# Patient Record
Sex: Male | Born: 1955 | Race: White | Hispanic: No | Marital: Married | Smoking: Current every day smoker
Health system: Southern US, Community
[De-identification: ages and names within clinical notes are randomized; demographics above are authoritative.]

## PROBLEM LIST (undated history)

## (undated) DIAGNOSIS — I4891 Unspecified atrial fibrillation: Secondary | ICD-10-CM

## (undated) DIAGNOSIS — I639 Cerebral infarction, unspecified: Secondary | ICD-10-CM

## (undated) HISTORY — PX: LUNG SURGERY: SHX703

---

## 2020-05-24 DIAGNOSIS — I4891 Unspecified atrial fibrillation: Secondary | ICD-10-CM | POA: Insufficient documentation

## 2020-05-24 DIAGNOSIS — Z87891 Personal history of nicotine dependence: Secondary | ICD-10-CM | POA: Insufficient documentation

## 2020-05-24 DIAGNOSIS — K7689 Other specified diseases of liver: Secondary | ICD-10-CM | POA: Insufficient documentation

## 2020-05-24 DIAGNOSIS — J439 Emphysema, unspecified: Secondary | ICD-10-CM | POA: Insufficient documentation

## 2020-05-24 DIAGNOSIS — J9601 Acute respiratory failure with hypoxia: Secondary | ICD-10-CM | POA: Insufficient documentation

## 2020-05-24 DIAGNOSIS — J984 Other disorders of lung: Secondary | ICD-10-CM | POA: Insufficient documentation

## 2020-05-26 DIAGNOSIS — R0902 Hypoxemia: Secondary | ICD-10-CM | POA: Insufficient documentation

## 2020-05-26 DIAGNOSIS — Z9981 Dependence on supplemental oxygen: Secondary | ICD-10-CM | POA: Insufficient documentation

## 2020-05-26 DIAGNOSIS — D649 Anemia, unspecified: Secondary | ICD-10-CM | POA: Insufficient documentation

## 2020-05-31 DIAGNOSIS — I63531 Cerebral infarction due to unspecified occlusion or stenosis of right posterior cerebral artery: Secondary | ICD-10-CM | POA: Insufficient documentation

## 2020-06-04 DIAGNOSIS — Z7901 Long term (current) use of anticoagulants: Secondary | ICD-10-CM | POA: Insufficient documentation

## 2020-08-05 DIAGNOSIS — Z8673 Personal history of transient ischemic attack (TIA), and cerebral infarction without residual deficits: Secondary | ICD-10-CM | POA: Insufficient documentation

## 2020-08-05 DIAGNOSIS — R7303 Prediabetes: Secondary | ICD-10-CM | POA: Insufficient documentation

## 2020-11-17 DIAGNOSIS — F17210 Nicotine dependence, cigarettes, uncomplicated: Secondary | ICD-10-CM | POA: Insufficient documentation

## 2020-11-17 DIAGNOSIS — H2513 Age-related nuclear cataract, bilateral: Secondary | ICD-10-CM | POA: Insufficient documentation

## 2020-11-17 DIAGNOSIS — A310 Pulmonary mycobacterial infection: Secondary | ICD-10-CM | POA: Insufficient documentation

## 2020-12-23 DIAGNOSIS — R911 Solitary pulmonary nodule: Secondary | ICD-10-CM | POA: Insufficient documentation

## 2021-04-15 ENCOUNTER — Encounter: Payer: Self-pay | Admitting: Urology

## 2021-04-15 ENCOUNTER — Other Ambulatory Visit: Payer: Self-pay

## 2021-04-15 ENCOUNTER — Ambulatory Visit (INDEPENDENT_AMBULATORY_CARE_PROVIDER_SITE_OTHER): Payer: 59 | Admitting: Urology

## 2021-04-15 VITALS — BP 122/78 | HR 92 | Ht 73.0 in | Wt 161.0 lb

## 2021-04-15 DIAGNOSIS — R972 Elevated prostate specific antigen [PSA]: Secondary | ICD-10-CM | POA: Diagnosis not present

## 2021-04-15 DIAGNOSIS — N401 Enlarged prostate with lower urinary tract symptoms: Secondary | ICD-10-CM | POA: Diagnosis not present

## 2021-04-15 MED ORDER — TAMSULOSIN HCL 0.4 MG PO CAPS: 0.4000 mg | ORAL_CAPSULE | Freq: Every day | ORAL | 0 refills | Status: AC

## 2021-04-15 NOTE — Progress Notes (Signed)
04/15/2021 3:49 PM   Jesse Davenport 1956-06-15 [id number]  Referring provider: Lorn Junes, FNP [address] [place],  [place] [address]  Chief Complaint  Patient presents with   Elevated PSA    HPI: Jesse Davenport is a 65 y.o. male referred for evaluation of an elevated PSA.  PSA 01/30/2021 elevated 6.6; no previous PSA results + Lower urinary tract symptoms including decreased force and caliber of his urinary stream Denies UTI, gross hematuria No family history prostate cancer No prior urologic history or previous urologic evaluation On Eliquis for atrial fibrillation, DVT and CVA   PMH: LUE brachial vein DVT Atrial fibrillation Right PCA occipital lobe infarct 2021  Surgical History:  EXTRACTION CATARACT EXTRACAPSULAR W/INSERTION INTRAOCULAR PROSTHESIS Left 11/19/2020  Procedure: LEFT: Phaco/Monofocal -EXTRACAPSULAR CATARACT PHACO REMOVAL WITH INSERTION OF INTRAOCULAR LENS PROSTHESIS (1 STAGE PROCEDURE), MANUAL OR MECHANICAL TECHNIQUE; WITHOUT ENDOSCOPIC CYCLOPHOTOCOAGULATION; Surgeon: Synetta Fail, MD; Location: [facility]; Service: Ophthalmology; Laterality: Left;   EXTRACTION CATARACT EXTRACAPSULAR W/INSERTION INTRAOCULAR PROSTHESIS Right 12/03/2020  Procedure: RIGHT: Phaco/Monofocal -EXTRACAPSULAR CATARACT PHACO REMOVAL WITH INSERTION OF INTRAOCULAR LENS PROSTHESIS (1 STAGE PROCEDURE), MANUAL OR MECHANICAL TECHNIQUE; WITHOUT ENDOSCOPIC CYCLOPHOTOCOAGULATION; Surgeon: Synetta Fail, MD; Location: [facility]; Service: Ophthalmology; Laterality: Right;   EYE SURGERY  as an infant   HERNIA REPAIR   LENS EYE SURGERY Left 11/19/2020  pciol   THORACOSCOPY WITH DECORTICATION LUNG Left 05/30/2020  Procedure: THORACOSCOPY, SURGICAL; DECORT,; Surgeon: Jennings Books, MD; Location: [facility]; Service: Cardiothoracic; Laterality: Left;   Home Medications:  Allergies as of 04/15/2021       Reactions   Levofloxacin Anaphylaxis    Levalbuterol         Medication List        Accurate as of April 15, 2021 11:59 PM. If you have any questions, ask your nurse or doctor.          albuterol 108 (90 Base) MCG/ACT inhaler Commonly known as: VENTOLIN HFA SMARTSIG:2 Puff(s) By Mouth Every 6 Hours PRN   Eliquis 5 MG Tabs tablet Generic drug: apixaban Take 5 mg by mouth 2 (two) times daily.   Flovent HFA 110 MCG/ACT inhaler Generic drug: fluticasone Inhale 2 puffs into the lungs 2 (two) times daily.   Stiolto Respimat 2.5-2.5 MCG/ACT Aers Generic drug: Tiotropium Bromide-Olodaterol SMARTSIG:2 Puff(s) Via Inhaler Daily   tamsulosin 0.4 MG Caps capsule Commonly known as: FLOMAX Take 1 capsule (0.4 mg total) by mouth daily. Started by: Riki Altes, MD        Allergies:  Allergies  Allergen Reactions   Levofloxacin Anaphylaxis   Levalbuterol     Family History: History reviewed. No pertinent family history.  Social History:  reports that he has been smoking cigarettes. He has never used smokeless tobacco. No history on file for alcohol use and drug use.   Physical Exam: BP 122/78   Pulse 92   Ht 6\' 1"  (1.854 m)   Wt 161 lb (73 kg)   BMI 21.24 kg/m   Constitutional:  Alert and oriented, No acute distress. HEENT: [place] AT, moist mucus membranes.  Trachea midline, no masses. Cardiovascular: No clubbing, cyanosis, or edema. Respiratory: Normal respiratory effort, no increased work of breathing. GU: Prostate 40 g, smooth without nodules Psychiatric: Normal mood and affect.   Assessment & Plan:    1.  Elevated PSA Although PSA is a prostate cancer screening test he was informed that cancer is not the most common cause of an elevated PSA. Other potential causes  including BPH and inflammation were discussed. He was informed that the only way to adequately diagnose prostate cancer would be a transrectal ultrasound and biopsy of the prostate. The procedure was discussed including potential  risks of bleeding and infection/sepsis. He was also informed that a negative biopsy does not conclusively rule out the possibility that prostate cancer may be present and that continued monitoring is required. The use of newer adjunctive blood tests including 4kScore was discussed. The use of multiparametric prostate MRI to identify lesions suspicious for high-grade prostate cancer was discussed as well as continued periodic surveillance.  This is his first PSA and will repeat after tamsulosin 0.4 mg x 30 days If persistently elevated based on age would recommend either MRI or biopsy and not surveillance  2.  BPH with LUTS Trial tamsulosin as above   Riki Altes, MD  [facility] [address], [address] [place], [place] [address] [phone]

## 2021-04-16 ENCOUNTER — Encounter: Payer: Self-pay | Admitting: Urology

## 2021-04-21 ENCOUNTER — Ambulatory Visit: Payer: Self-pay | Admitting: Family Medicine

## 2021-05-04 ENCOUNTER — Telehealth: Payer: 59 | Admitting: Physician Assistant

## 2021-05-04 DIAGNOSIS — J441 Chronic obstructive pulmonary disease with (acute) exacerbation: Secondary | ICD-10-CM | POA: Diagnosis not present

## 2021-05-04 MED ORDER — AZITHROMYCIN 250 MG PO TABS: ORAL_TABLET | ORAL | 0 refills | Status: DC

## 2021-05-04 MED ORDER — BENZONATATE 100 MG PO CAPS
100.0000 mg | ORAL_CAPSULE | Freq: Three times a day (TID) | ORAL | 0 refills | Status: DC | PRN
Start: 2021-05-04 — End: 2021-09-07

## 2021-05-04 NOTE — Progress Notes (Signed)
Virtual Visit Consent   Jesse Davenport, you are scheduled for a virtual visit with a [facility] provider today.     Just as with appointments in the office, your consent must be obtained to participate.  Your consent will be active for this visit and any virtual visit you may have with one of our providers in the next 365 days.     If you have a MyChart account, a copy of this consent can be sent to you electronically.  All virtual visits are billed to your insurance company just like a traditional visit in the office.    As this is a virtual visit, video technology does not allow for your provider to perform a traditional examination.  This may limit your provider's ability to fully assess your condition.  If your provider identifies any concerns that need to be evaluated in person or the need to arrange testing (such as labs, EKG, etc.), we will make arrangements to do so.     Although advances in technology are sophisticated, we cannot ensure that it will always work on either your end or our end.  If the connection with a video visit is poor, the visit may have to be switched to a telephone visit.  With either a video or telephone visit, we are not always able to ensure that we have a secure connection.     I need to obtain your verbal consent now.   Are you willing to proceed with your visit today?    Jesse Davenport has provided verbal consent on 05/04/2021 for a virtual visit (video or telephone).   Mar Daring, PA-C   Date: 05/04/2021 5:09 PM   Virtual Visit via Video Note   I, Mar Daring, connected with  Jesse Davenport  ([id number], 12/06/1955) on 05/04/21 at  5:00 PM EDT by a video-enabled telemedicine application and verified that I am speaking with the correct person using two identifiers.  Location: Patient: Virtual Visit Location Patient: Home Provider: Virtual Visit Location Provider: Home Office   I discussed the limitations of evaluation and  management by telemedicine and the availability of in person appointments. The patient expressed understanding and agreed to proceed.    History of Present Illness: Jesse Davenport is a 65 y.o. who identifies as a male who was assigned male at birth, and is being seen today for worsening shortness of breath, congestion, cough. Noticed worsening last night. Has had sick contacts with grandchildren, they have tested negative for covid and flu. Temp was 99.9 today. Denies any body aches and chills. 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    Problems: There are no problems to display for this patient.   Allergies:  Allergies  Allergen Reactions   Levofloxacin Anaphylaxis   Levalbuterol    Medications:  Current Outpatient Medications:    azithromycin (ZITHROMAX) 250 MG tablet, Take 2 tablets PO on day one, and one tablet PO daily thereafter until completed., Disp: 6 tablet, Rfl: 0   benzonatate (TESSALON) 100 MG capsule, Take 1 capsule (100 mg total) by mouth 3 (three) times daily as needed., Disp: 30 capsule, Rfl: 0   albuterol (VENTOLIN HFA) 108 (90 Base) MCG/ACT inhaler,  SMARTSIG:2 Puff(s) By Mouth Every 6 Hours PRN, Disp: , Rfl:    ELIQUIS 5 MG TABS tablet, Take 5 mg by mouth 2 (two) times daily., Disp: , Rfl:    FLOVENT HFA 110 MCG/ACT inhaler, Inhale 2 puffs into the lungs 2 (two) times daily., Disp: , Rfl:    STIOLTO RESPIMAT 2.5-2.5 MCG/ACT AERS, SMARTSIG:2 Puff(s) Via Inhaler Daily, Disp: , Rfl:    tamsulosin (FLOMAX) 0.4 MG CAPS capsule, Take 1 capsule (0.4 mg total) by mouth daily., Disp: 30 capsule, Rfl: 0  Observations/Objective: Patient is well-developed, well-nourished  in no acute distress.  Resting comfortably at home.  Head is normocephalic, atraumatic.  Pursed lip breathing, but no speech issues and reports fairly normal. Speech is clear and coherent with logical content.  Patient is alert and oriented at baseline.    Assessment and Plan: 1. COPD with acute exacerbation (HCC) - azithromycin (ZITHROMAX) 250 MG tablet; Take 2 tablets PO on day one, and one tablet PO daily thereafter until completed.  Dispense: 6 tablet; Refill: 0 - benzonatate (TESSALON) 100 MG capsule; Take 1 capsule (100 mg total) by mouth 3 (three) times daily as needed.  Dispense: 30 capsule; Refill: 0  - Very high risk for pneumonia - Did advise for covid testing and to message if positive - Will go ahead and treat with Zpak and tessalon perles - Continue all home inhalers - Seek in person evaluation if symptoms worsen or fail to improve  Follow Up Instructions: I discussed the assessment and treatment plan with the patient. The patient was provided an opportunity to ask questions and all were answered. The patient agreed with the plan and demonstrated an understanding of the instructions.  A copy of instructions were sent to the patient via MyChart unless otherwise noted below.    The patient was advised to call back or seek an in-person evaluation if the symptoms worsen or if the condition fails to improve as anticipated.  Time:  I spent 12 minutes with the patient via telehealth technology discussing the above problems/concerns.    Mar Daring, PA-C

## 2021-05-04 NOTE — Patient Instructions (Signed)
Jesse Davenport, thank you for joining Margaretann Loveless, PA-C for today's virtual visit.  While this provider is not your primary care provider (PCP), if your PCP is located in our provider database this encounter information will be shared with them immediately following your visit.  Consent: (Patient) Jesse Davenport provided verbal consent for this virtual visit at the beginning of the encounter.  Current Medications:  Current Outpatient Medications:    azithromycin (ZITHROMAX) 250 MG tablet, Take 2 tablets PO on day one, and one tablet PO daily thereafter until completed., Disp: 6 tablet, Rfl: 0   benzonatate (TESSALON) 100 MG capsule, Take 1 capsule (100 mg total) by mouth 3 (three) times daily as needed., Disp: 30 capsule, Rfl: 0   albuterol (VENTOLIN HFA) 108 (90 Base) MCG/ACT inhaler, SMARTSIG:2 Puff(s) By Mouth Every 6 Hours PRN, Disp: , Rfl:    ELIQUIS 5 MG TABS tablet, Take 5 mg by mouth 2 (two) times daily., Disp: , Rfl:    FLOVENT HFA 110 MCG/ACT inhaler, Inhale 2 puffs into the lungs 2 (two) times daily., Disp: , Rfl:    STIOLTO RESPIMAT 2.5-2.5 MCG/ACT AERS, SMARTSIG:2 Puff(s) Via Inhaler Daily, Disp: , Rfl:    tamsulosin (FLOMAX) 0.4 MG CAPS capsule, Take 1 capsule (0.4 mg total) by mouth daily., Disp: 30 capsule, Rfl: 0   Medications ordered in this encounter:  Meds ordered this encounter  Medications   azithromycin (ZITHROMAX) 250 MG tablet    Sig: Take 2 tablets PO on day one, and one tablet PO daily thereafter until completed.    Dispense:  6 tablet    Refill:  0    Order Specific Question:   Supervising Provider    Answer:   MILLER, BRIAN [[id number]]   benzonatate (TESSALON) 100 MG capsule    Sig: Take 1 capsule (100 mg total) by mouth 3 (three) times daily as needed.    Dispense:  30 capsule    Refill:  0    Order Specific Question:   Supervising Provider    Answer:   Hyacinth Meeker, BRIAN [[id number]]     *If you need refills on other medications prior to your next  appointment, please contact your pharmacy*  Follow-Up: Call back or seek an in-person evaluation if the symptoms worsen or if the condition fails to improve as anticipated.  Other Instructions Chronic Obstructive Pulmonary Disease Exacerbation Chronic obstructive pulmonary disease (COPD) is a long-term (chronic) lung problem. In COPD, the flow of air from the lungs is limited. COPD exacerbations are times that breathing gets worse and you need more than your normal treatment. Without treatment, they can be life-threatening. If they happen often, your lungs can become more damaged. What are the causes? Having infections that affect your airways and lungs. Being exposed to: Smoke. Air pollution. Chemical fumes. Dust. Things that can cause an allergic reaction (allergens). Not taking your usual COPD medicines as told. Having medical problems already, such as heart failure or infections not involving the lungs. In many cases, the cause is not known. What increases the risk? Smoking. Being an older adult. Having frequent prior COPD exacerbations. What are the signs or symptoms? Increased coughing. Increased mucus from your lungs. Increased wheezing. Increased shortness of breath. Fast breathing and finding it hard to breathe. Chest tightness. Less energy than usual. Sleep disruption from symptoms. Confusion. Increased sleepiness. Often, these symptoms happen or get worse even with the use of medicines. How is this treated? Treatment for this condition depends on how bad it is  and the cause of the symptoms. You may need to stay in the hospital for treatment. Treatment may include: Taking medicines. Using oxygen. Being treated with different ways to clear your airway, such as using a mask to deliver oxygen. Follow these instructions at home: Medicines Take over-the-counter and prescription medicines only as told by your doctor. Use all inhaled medicines the correct way. If you  were prescribed an antibiotic or steroid medicine, take it as told by your doctor. Do not stop taking it even if you start to feel better. Lifestyle Do not smoke or use any products that contain nicotine or tobacco. If you need help quitting, ask your doctor. Eat healthy foods. Exercise regularly. Get enough sleep. Most adults need 7 or more hours per night. Avoid tobacco smoke and other things that can bother your lungs. Several times a day, wash your hands with soap and water for at least 20 seconds. If you cannot use soap and water, use hand sanitizer. This may help keep you from getting an infection. During flu season, avoid areas that are crowded with people. General instructions Drink enough fluid to keep your pee (urine) pale yellow. Do not do this if your doctor has told you not to. Use a cool mist machine (vaporizer). If you use oxygen or a machine that turns medicine into a mist (nebulizer), continue to use it as told. Keep all follow-up visits. How is this prevented? Keep up with shots (vaccinations) as told by your doctor. Be sure to get a yearly flu (influenza) shot. If you smoke, quit smoking. Smoking makes the problem worse. Follow all instructions for rehabilitation. These are steps you can take to make your body work better. Work with your doctor to develop and follow an action plan. This tells you what steps to take when you experience certain symptoms. Contact a doctor if: Your COPD symptoms get worse than normal. Get help right away if: You are short of breath and it gets worse, even when you are resting. You have trouble talking. You have chest pain. You cough up blood. You have a fever. You keep vomiting. You feel weak or you pass out (faint). You feel confused. You are not able to sleep because of your symptoms. You have trouble doing daily activities. These symptoms may be an emergency. Get help right away. Call your local emergency services (911 in the  [place] Do not wait to see if the symptoms will go away. Do not drive yourself to the hospital. Summary COPD exacerbations are times that breathing gets worse and you need more treatment than normal. COPD exacerbations can be very serious and may cause your lungs to become more damaged. Do not smoke. If you need help quitting, ask your doctor. Stay up to date on your shots. Get a flu shot every year. This information is not intended to replace advice given to you by your health care provider. Make sure you discuss any questions you have with your health care provider. Document Revised: 05/28/2020 Document Reviewed: 05/13/2020 Elsevier Patient Education  2022 [employer].    If you have been instructed to have an in-person evaluation today at a local Urgent Care facility, please use the link below. It will take you to a list of all of our available [facility], including address, phone number and hours of operation. Please do not delay care.  [facility]  If you or a family member do not have a primary care provider, use the  link below to schedule a visit and establish care. When you choose a [facility] primary care physician or advanced practice provider, you gain a long-term partner in health. Find a Primary Care Provider  Learn more about [facility]'s in-office and virtual care options: [facility]

## 2021-05-05 ENCOUNTER — Other Ambulatory Visit (INDEPENDENT_AMBULATORY_CARE_PROVIDER_SITE_OTHER): Payer: Self-pay | Admitting: Nurse Practitioner

## 2021-05-05 DIAGNOSIS — I739 Peripheral vascular disease, unspecified: Secondary | ICD-10-CM

## 2021-05-06 ENCOUNTER — Other Ambulatory Visit: Payer: Self-pay

## 2021-05-06 ENCOUNTER — Ambulatory Visit (INDEPENDENT_AMBULATORY_CARE_PROVIDER_SITE_OTHER): Payer: 59

## 2021-05-06 ENCOUNTER — Ambulatory Visit (INDEPENDENT_AMBULATORY_CARE_PROVIDER_SITE_OTHER): Payer: 59 | Admitting: Nurse Practitioner

## 2021-05-06 VITALS — BP 101/67 | HR 67 | Ht 73.0 in | Wt 160.0 lb

## 2021-05-06 DIAGNOSIS — I739 Peripheral vascular disease, unspecified: Secondary | ICD-10-CM

## 2021-05-06 DIAGNOSIS — F17210 Nicotine dependence, cigarettes, uncomplicated: Secondary | ICD-10-CM | POA: Diagnosis not present

## 2021-05-12 ENCOUNTER — Encounter (INDEPENDENT_AMBULATORY_CARE_PROVIDER_SITE_OTHER): Payer: Self-pay | Admitting: Nurse Practitioner

## 2021-05-12 NOTE — Progress Notes (Signed)
Subjective:    Patient ID: Jesse Davenport, male    DOB: 1955-09-13, 65 y.o.   MRN: [id number] Chief Complaint  Patient presents with   New Patient (Initial Visit)    Np crawford PAD abi     Jesse Davenport is a 65 year old male that presents today for evaluation for discoloration in his feet after sitting dependent for extended period of time.  The patient notes that if he is able to get up and move his feet and legs the discoloration of the leg.  He also notes his feet are cold as well at times.  He denies any lower extremity edema.  He also denies any claudication symptoms.  He denies any rest pain or wounds or ulcerations.  Today noninvasive studies show an ABI 1.12 bilaterally with a TBI 0.73 on the right and 0.66 on the left.  Patient has triphasic tibial artery waveforms bilaterally with slightly dampened toe waveforms bilaterally   Review of Systems  Cardiovascular:  Negative for leg swelling.  Skin:  Positive for color change.  All other systems reviewed and are negative.     Objective:   Physical Exam Vitals reviewed.  HENT:     Head: Normocephalic.  Cardiovascular:     Rate and Rhythm: Normal rate.     Pulses:          Dorsalis pedis pulses are 1+ on the right side and 1+ on the left side.       Posterior tibial pulses are 1+ on the right side and 1+ on the left side.  Pulmonary:     Effort: Pulmonary effort is normal.  Skin:    General: Skin is dry.  Neurological:     Mental Status: He is alert and oriented to person, place, and time.  Psychiatric:        Mood and Affect: Mood normal.        Behavior: Behavior normal.        Thought Content: Thought content normal.        Judgment: Judgment normal.    BP 101/67   Pulse 67   Ht 6\' 1"  (1.854 m)   Wt 160 lb (72.6 kg)   BMI 21.11 kg/m   History reviewed. No pertinent past medical history.  Social History   Socioeconomic History   Marital status: Married    Spouse name: Not on file   Number of  children: Not on file   Years of education: Not on file   Highest education level: Not on file  Occupational History   Not on file  Tobacco Use   Smoking status: Every Day    Types: Cigarettes   Smokeless tobacco: Never  Substance and Sexual Activity   Alcohol use: Not on file   Drug use: Not on file   Sexual activity: Not on file  Other Topics Concern   Not on file  Social History Narrative   Not on file   Social Determinants of Health   Financial Resource Strain: Not on file  Food Insecurity: Not on file  Transportation Needs: Not on file  Physical Activity: Not on file  Stress: Not on file  Social Connections: Not on file  Intimate Partner Violence: Not on file    History reviewed. No pertinent surgical history.  History reviewed. No pertinent family history.  Allergies  Allergen Reactions   Levofloxacin Anaphylaxis   Levalbuterol     No flowsheet data found.    CMP  No results found  for: NA, K, CL, CO2, GLUCOSE, BUN, CREATININE, CALCIUM, PROT, ALBUMIN, AST, ALT, ALKPHOS, BILITOT, GFRNONAA, GFRAA   No results found.     Assessment & Plan:   1. PAD (peripheral artery disease) (HCC) The patient has some lower extremity discoloration following prolonged periods of dependency but this may be related to venous disease.  Currently he has some evidence of peripheral arterial disease but nothing significant for intervention at this time.  There is generally nothing that indicates ischemia at this time.  However given his tobacco usage as well as evidence of minimal PAD we will have him follow-up in 1 year.  However patient is advised that changes can happen if there is development of claudication-like symptoms should contact her office for sooner evaluation.  2. Light smoker Smoking cessation was discussed, 3-10 minutes spent on this topic specifically    Current Outpatient Medications on File Prior to Visit  Medication Sig Dispense Refill   albuterol (VENTOLIN  HFA) 108 (90 Base) MCG/ACT inhaler SMARTSIG:2 Puff(s) By Mouth Every 6 Hours PRN     azithromycin (ZITHROMAX) 250 MG tablet Take 2 tablets PO on day one, and one tablet PO daily thereafter until completed. 6 tablet 0   benzonatate (TESSALON) 100 MG capsule Take 1 capsule (100 mg total) by mouth 3 (three) times daily as needed. 30 capsule 0   ELIQUIS 5 MG TABS tablet Take 5 mg by mouth 2 (two) times daily.     FLOVENT HFA 110 MCG/ACT inhaler Inhale 2 puffs into the lungs 2 (two) times daily.     ketorolac (ACULAR) 0.5 % ophthalmic solution Place into the left eye.     STIOLTO RESPIMAT 2.5-2.5 MCG/ACT AERS SMARTSIG:2 Puff(s) Via Inhaler Daily     tamsulosin (FLOMAX) 0.4 MG CAPS capsule Take 1 capsule (0.4 mg total) by mouth daily. 30 capsule 0   No current facility-administered medications on file prior to visit.    There are no Patient Instructions on file for this visit. No follow-ups on file.   Georgiana Spinner, NP

## 2021-05-18 ENCOUNTER — Other Ambulatory Visit: Payer: Self-pay

## 2021-05-18 ENCOUNTER — Other Ambulatory Visit: Payer: 59

## 2021-05-18 DIAGNOSIS — R972 Elevated prostate specific antigen [PSA]: Secondary | ICD-10-CM

## 2021-05-19 LAB — PSA: Prostate Specific Ag, Serum: 14 ng/mL — ABNORMAL HIGH (ref 0.0–4.0)

## 2021-05-20 ENCOUNTER — Encounter: Payer: Self-pay | Admitting: Urology

## 2021-05-20 ENCOUNTER — Telehealth: Payer: Self-pay | Admitting: Urology

## 2021-05-20 ENCOUNTER — Encounter: Payer: Self-pay | Admitting: *Deleted

## 2021-05-20 DIAGNOSIS — R972 Elevated prostate specific antigen [PSA]: Secondary | ICD-10-CM

## 2021-05-20 NOTE — Telephone Encounter (Signed)
PSA has increased to 14.0.  Recommend scheduling prostate MRI.  Order was entered and will call with results.  Please let me know if he has any questions

## 2021-05-20 NOTE — Telephone Encounter (Signed)
Notified patient as instructed, patient pleased. Discussed follow-up appointments, patient agrees  

## 2021-06-26 ENCOUNTER — Ambulatory Visit
Admission: RE | Admit: 2021-06-26 | Discharge: 2021-06-26 | Disposition: A | Discharge status: Home / Self Care | Payer: 59 | Source: Ambulatory Visit | Attending: Urology | Admitting: Urology

## 2021-06-26 ENCOUNTER — Other Ambulatory Visit: Payer: Self-pay

## 2021-06-26 DIAGNOSIS — R972 Elevated prostate specific antigen [PSA]: Secondary | ICD-10-CM | POA: Insufficient documentation

## 2021-06-26 IMAGING — MR MR PROSTATE WO/W CM
56 series · 56 of 56 positions shown · IV contrast (7.5ml Gadavist)
Comparison: None.

CLINICAL DATA: Elevated PSA.  PSA equal 14.0.  65-year-old male.

EXAM:
MR PROSTATE WITHOUT AND WITH CONTRAST
TECHNIQUE: Multiplanar multisequence MRI images were obtained of the pelvis
centered about the prostate. Pre and post contrast images were
obtained.
CONTRAST:  7.5mL GADAVIST GADOBUTROL 1 MMOL/ML IV SOLN

[Series 3: ax in&out whole · axial · 3.0mm · 1.19mm/px · 1 of 96 slices shown (1 of 2)]
[im 1/96]
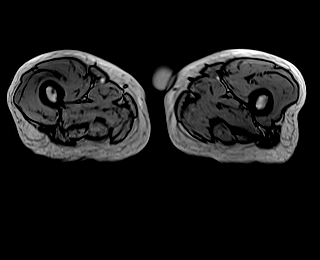

[Series 4: ax in&out whole · axial · 3.0mm · 1.19mm/px · 1 of 96 slices shown (2 of 2)]
[im 1/96]
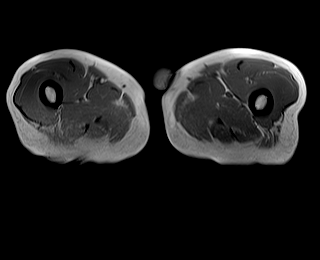

[Series 5: T2 · coronal · 3.0mm · 0.70mm/px · 1 of 35 slices shown (1 of 3)]
[im 1/35]
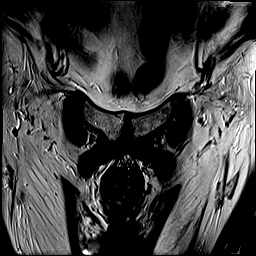

[Series 6: T2 · axial · 3.0mm · 0.56mm/px · 1 of 25 slices shown (2 of 3)]
[im 1/25]
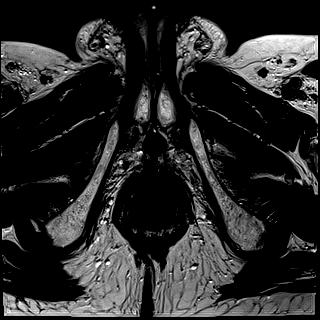

[Series 7: DWI · axial · 3.0mm · 0.86mm/px · 1 of 75 slices shown (1 of 3)]
[im 1/75]
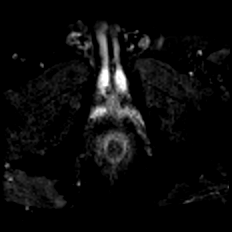

[Series 8: DWI · axial · 3.0mm · 0.86mm/px · 1 of 25 slices shown (2 of 3)]
[im 1/25]
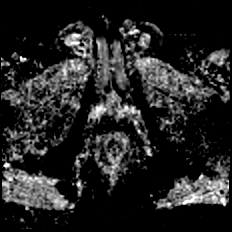

[Series 9: DWI · axial · 3.0mm · 0.86mm/px · 1 of 25 slices shown (3 of 3)]
[im 1/25]
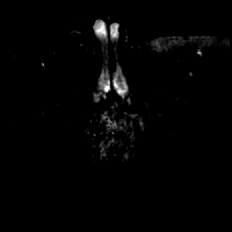

[Series 10: T2 · axial · 1.0mm · 1.04mm/px · 1 of 72 slices shown (3 of 3)]
[im 1/72]
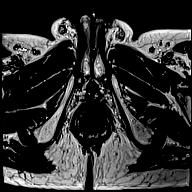

[Series 11: T1 · axial · 3.0mm · 1.15mm/px · 1 of 28 slices shown (1 of 48)]
[im 1/28]
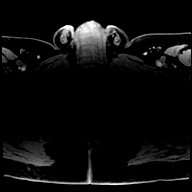

[Series 12: T1 · axial · 3.0mm · 1.15mm/px · 1 of 28 slices shown (2 of 48)]
[im 1/28]
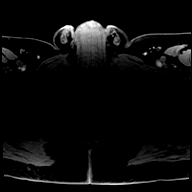

[Series 13: T1 · axial · 3.0mm · 1.15mm/px · 1 of 28 slices shown (3 of 48)]
[im 1/28]
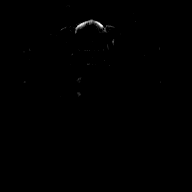

[Series 14: T1 · axial · 3.0mm · 1.15mm/px · 1 of 28 slices shown (4 of 48)]
[im 1/28]
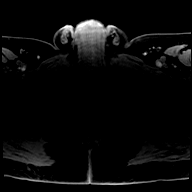

[Series 15: T1 · axial · 3.0mm · 1.15mm/px · 1 of 28 slices shown (5 of 48)]
[im 1/28]
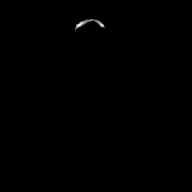

[Series 16: T1 · axial · 3.0mm · 1.15mm/px · 1 of 28 slices shown (6 of 48)]
[im 1/28]
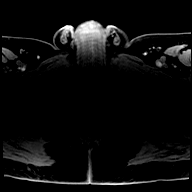

[Series 17: T1 · axial · 3.0mm · 1.15mm/px · 1 of 28 slices shown (7 of 48)]
[im 1/28]
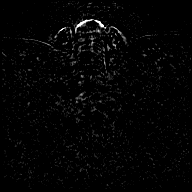

[Series 18: T1 · axial · 3.0mm · 1.15mm/px · 1 of 28 slices shown (8 of 48)]
[im 1/28]
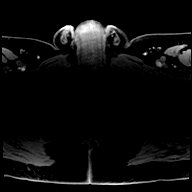

[Series 19: T1 · axial · 3.0mm · 1.15mm/px · 1 of 28 slices shown (9 of 48)]
[im 1/28]
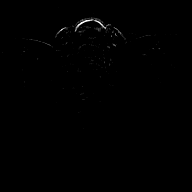

[Series 20: T1 · axial · 3.0mm · 1.15mm/px · 1 of 28 slices shown (10 of 48)]
[im 1/28]
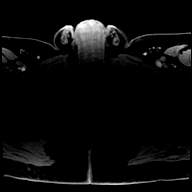

[Series 21: T1 · axial · 3.0mm · 1.15mm/px · 1 of 28 slices shown (11 of 48)]
[im 1/28]
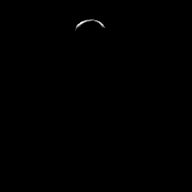

[Series 22: T1 · axial · 3.0mm · 1.15mm/px · 1 of 28 slices shown (12 of 48)]
[im 1/28]
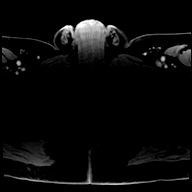

[Series 23: T1 · axial · 3.0mm · 1.15mm/px · 1 of 28 slices shown (13 of 48)]
[im 1/28]
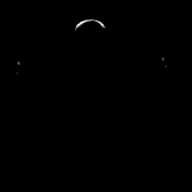

[Series 24: T1 · axial · 3.0mm · 1.15mm/px · 1 of 28 slices shown (14 of 48)]
[im 1/28]
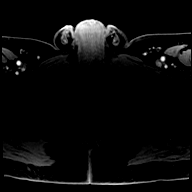

[Series 25: T1 · axial · 3.0mm · 1.15mm/px · 1 of 28 slices shown (15 of 48)]
[im 1/28]
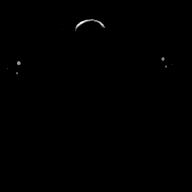

[Series 26: T1 · axial · 3.0mm · 1.15mm/px · 1 of 28 slices shown (16 of 48)]
[im 1/28]
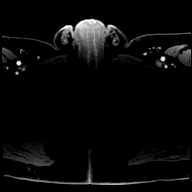

[Series 27: T1 · axial · 3.0mm · 1.15mm/px · 1 of 28 slices shown (17 of 48)]
[im 1/28]
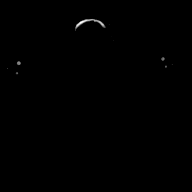

[Series 28: T1 · axial · 3.0mm · 1.15mm/px · 1 of 28 slices shown (18 of 48)]
[im 1/28]
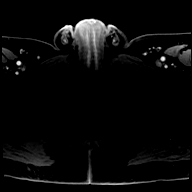

[Series 29: T1 · axial · 3.0mm · 1.15mm/px · 1 of 28 slices shown (19 of 48)]
[im 1/28]
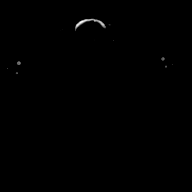

[Series 30: T1 · axial · 3.0mm · 1.15mm/px · 1 of 28 slices shown (20 of 48)]
[im 1/28]
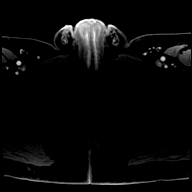

[Series 31: T1 · axial · 3.0mm · 1.15mm/px · 1 of 28 slices shown (21 of 48)]
[im 1/28]
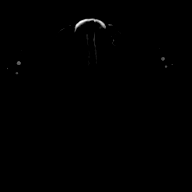

[Series 32: T1 · axial · 3.0mm · 1.15mm/px · 1 of 28 slices shown (22 of 48)]
[im 1/28]
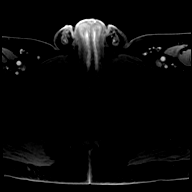

[Series 33: T1 · axial · 3.0mm · 1.15mm/px · 1 of 28 slices shown (23 of 48)]
[im 1/28]
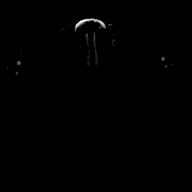

[Series 34: T1 · axial · 3.0mm · 1.15mm/px · 1 of 28 slices shown (24 of 48)]
[im 1/28]
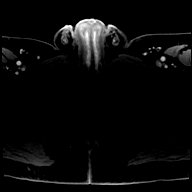

[Series 35: T1 · axial · 3.0mm · 1.15mm/px · 1 of 28 slices shown (25 of 48)]
[im 1/28]
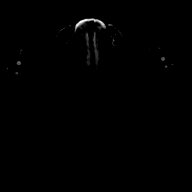

[Series 36: T1 · axial · 3.0mm · 1.15mm/px · 1 of 28 slices shown (26 of 48)]
[im 1/28]
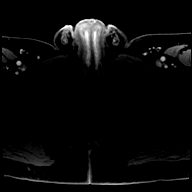

[Series 37: T1 · axial · 3.0mm · 1.15mm/px · 1 of 28 slices shown (27 of 48)]
[im 1/28]
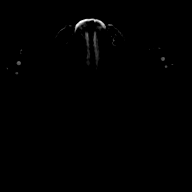

[Series 38: T1 · axial · 3.0mm · 1.15mm/px · 1 of 28 slices shown (28 of 48)]
[im 1/28]
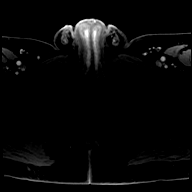

[Series 39: T1 · axial · 3.0mm · 1.15mm/px · 1 of 28 slices shown (29 of 48)]
[im 1/28]
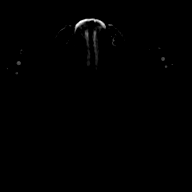

[Series 40: T1 · axial · 3.0mm · 1.15mm/px · 1 of 28 slices shown (30 of 48)]
[im 1/28]
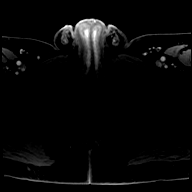

[Series 41: T1 · axial · 3.0mm · 1.15mm/px · 1 of 28 slices shown (31 of 48)]
[im 1/28]
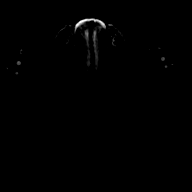

[Series 42: T1 · axial · 3.0mm · 1.15mm/px · 1 of 28 slices shown (32 of 48)]
[im 1/28]
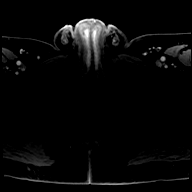

[Series 43: T1 · axial · 3.0mm · 1.15mm/px · 1 of 28 slices shown (33 of 48)]
[im 1/28]
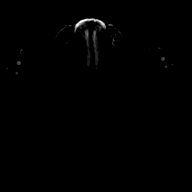

[Series 44: T1 · axial · 3.0mm · 1.15mm/px · 1 of 28 slices shown (34 of 48)]
[im 1/28]
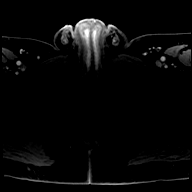

[Series 45: T1 · axial · 3.0mm · 1.15mm/px · 1 of 28 slices shown (35 of 48)]
[im 1/28]
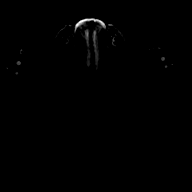

[Series 46: T1 · axial · 3.0mm · 1.15mm/px · 1 of 28 slices shown (36 of 48)]
[im 1/28]
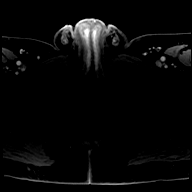

[Series 47: T1 · axial · 3.0mm · 1.15mm/px · 1 of 28 slices shown (37 of 48)]
[im 1/28]
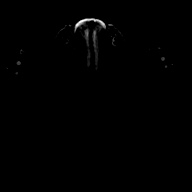

[Series 48: T1 · axial · 3.0mm · 1.15mm/px · 1 of 28 slices shown (38 of 48)]
[im 1/28]
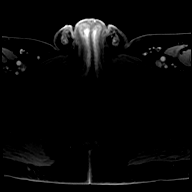

[Series 49: T1 · axial · 3.0mm · 1.15mm/px · 1 of 28 slices shown (39 of 48)]
[im 1/28]
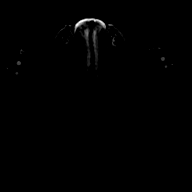

[Series 50: T1 · axial · 3.0mm · 1.15mm/px · 1 of 28 slices shown (40 of 48)]
[im 1/28]
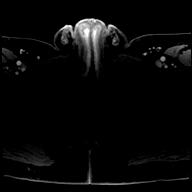

[Series 51: T1 · axial · 3.0mm · 1.15mm/px · 1 of 28 slices shown (41 of 48)]
[im 1/28]
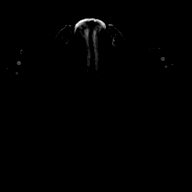

[Series 52: T1 · axial · 3.0mm · 1.15mm/px · 1 of 28 slices shown (42 of 48)]
[im 1/28]
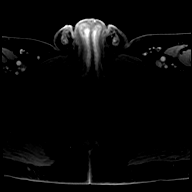

[Series 53: T1 · axial · 3.0mm · 1.15mm/px · 1 of 28 slices shown (43 of 48)]
[im 1/28]
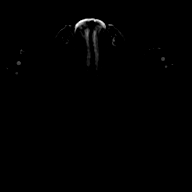

[Series 54: T1 · axial · 3.0mm · 1.15mm/px · 1 of 28 slices shown (44 of 48)]
[im 1/28]
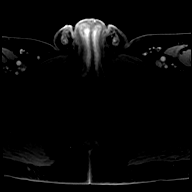

[Series 55: T1 · axial · 3.0mm · 1.15mm/px · 1 of 28 slices shown (45 of 48)]
[im 1/28]
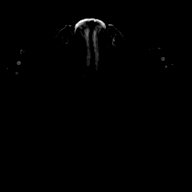

[Series 56: T1 · axial · 3.0mm · 1.15mm/px · 1 of 28 slices shown (46 of 48)]
[im 1/28]
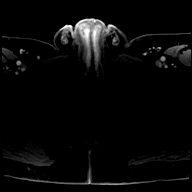

[Series 57: T1 · axial · 3.0mm · 1.15mm/px · 1 of 28 slices shown (47 of 48)]
[im 1/28]
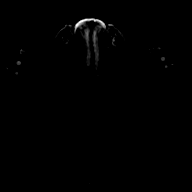

[Series 58: T1 · axial · 3.0mm · 1.15mm/px · 1 of 28 slices shown (48 of 48)]
[im 1/28]
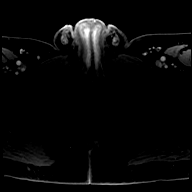

[56 of 56 positions shown; findings below may reference images not displayed]

FINDINGS: Prostate: No foci of restricted diffusion within the peripheral zone
(series 7 and 8). There is normal high signal intensity within
peripheral zone T2 weighted imaging without focal lesion (series 6)

The transitional zone is enlarged by nodules. One nodule in the LEFT
aspect the transition zone has indistinct borders and low signal
intensity on T2 weighted imaging measuring 13 mm by 11 mm (image
12/series 6). No restricted diffusion or abnormal enhancement of
this lesion.

No abnormal enhancement on postcontrast T1 weighted imaging.

Prostatic capsule is intact.  Seminal vesicles normal.

Volume: 4.9 x 3.4 x 3.8 cm (volume = 33 cm^3)

Transcapsular spread:  Absent

Seminal vesicle involvement: Absent

Neurovascular bundle involvement: Absent

Pelvic adenopathy: Absent

Bone metastasis: Absent

Other findings: None
IMPRESSION: 1. No high-grade carcinoma in peripheral zone.  PI-RADS: 1.
2. Poorly marginated transitional zone nodule in the LEFT lobe of
the prostate gland. Indeterminate finding. PI-RADS: 3. (Dynacad 3D
post processing performed). Region of interest #1

## 2021-06-26 MED ORDER — GADOBUTROL 1 MMOL/ML IV SOLN
7.5000 mL | Freq: Once | INTRAVENOUS | Status: AC | PRN
  Administered 2021-06-26: 7.5 mL via INTRAVENOUS

## 2021-06-29 ENCOUNTER — Other Ambulatory Visit: Payer: Self-pay | Admitting: Urology

## 2021-06-29 ENCOUNTER — Encounter: Payer: Self-pay | Admitting: Urology

## 2021-06-29 DIAGNOSIS — R972 Elevated prostate specific antigen [PSA]: Secondary | ICD-10-CM

## 2021-06-30 ENCOUNTER — Telehealth: Payer: Self-pay | Admitting: *Deleted

## 2021-06-30 NOTE — Telephone Encounter (Signed)
Notified patient as instructed,.  

## 2021-06-30 NOTE — Telephone Encounter (Signed)
-----   Message from Riki Altes, MD sent at 06/29/2021  7:27 AM EST ----- MyChart message sent to patient regarding MRI results and fusion biopsy recommended.  Please contact patient to make sure he sees this message.

## 2021-07-20 ENCOUNTER — Encounter: Payer: Self-pay | Admitting: Physician Assistant

## 2021-08-17 ENCOUNTER — Other Ambulatory Visit: Payer: Self-pay | Admitting: Urology

## 2021-08-28 ENCOUNTER — Telehealth: Payer: Self-pay | Admitting: Urology

## 2021-08-28 NOTE — Telephone Encounter (Signed)
Contacted patient regarding fusion biopsy results performed at [facility].  For ROI biopsies plus standard 12 core template biopsies were performed all showing benign prostate tissue.  He had no further questions.  Will schedule 12-month follow-up appointment with PSA prior

## 2021-09-07 ENCOUNTER — Telehealth: Payer: 59 | Admitting: Physician Assistant

## 2021-09-07 DIAGNOSIS — J441 Chronic obstructive pulmonary disease with (acute) exacerbation: Secondary | ICD-10-CM

## 2021-09-07 MED ORDER — BENZONATATE 100 MG PO CAPS: 100.0000 mg | ORAL_CAPSULE | Freq: Three times a day (TID) | ORAL | 0 refills | Status: AC | PRN

## 2021-09-07 MED ORDER — PREDNISONE 20 MG PO TABS: 40.0000 mg | ORAL_TABLET | Freq: Every day | ORAL | 0 refills | Status: AC

## 2021-09-07 MED ORDER — AZITHROMYCIN 250 MG PO TABS: ORAL_TABLET | ORAL | 0 refills | Status: AC

## 2021-09-07 NOTE — Progress Notes (Signed)
We are sorry that you are not feeling well.  Here is how we plan to help!  Based on your presentation I believe you most likely have A cough due to bacteria.  When patients have a fever and a productive cough with a change in color or increased sputum production, we are concerned about bacterial bronchitis.  If left untreated it can progress to pneumonia.  If your symptoms do not improve with your treatment plan it is important that you contact your provider.   I have prescribed Azithromyin 250 mg: two tablets now and then one tablet daily for 4 additonal days    In addition you may use A prescription cough medication called Tessalon Perles 100mg . You may take 1-2 capsules every 8 hours as needed for your cough.  I have prescribed Prednisone 20mg  Take 2 tablets once daily for 5 days.  From your responses in the eVisit questionnaire you describe inflammation in the upper respiratory tract which is causing a significant cough.  This is commonly called Bronchitis and has four common causes:   Allergies Viral Infections Acid Reflux Bacterial Infection Allergies, viruses and acid reflux are treated by controlling symptoms or eliminating the cause. An example might be a cough caused by taking certain blood pressure medications. You stop the cough by changing the medication. Another example might be a cough caused by acid reflux. Controlling the reflux helps control the cough.  USE OF BRONCHODILATOR ("RESCUE") INHALERS: There is a risk from using your bronchodilator too frequently.  The risk is that over-reliance on a medication which only relaxes the muscles surrounding the breathing tubes can reduce the effectiveness of medications prescribed to reduce swelling and congestion of the tubes themselves.  Although you feel brief relief from the bronchodilator inhaler, your asthma may actually be worsening with the tubes becoming more swollen and filled with mucus.  This can delay other crucial treatments,  such as oral steroid medications. If you need to use a bronchodilator inhaler daily, several times per day, you should discuss this with your provider.  There are probably better treatments that could be used to keep your asthma under control.     HOME CARE Only take medications as instructed by your medical team. Complete the entire course of an antibiotic. Drink plenty of fluids and get plenty of rest. Avoid close contacts especially the very young and the elderly Cover your mouth if you cough or cough into your sleeve. Always remember to wash your hands A steam or ultrasonic humidifier can help congestion.   GET HELP RIGHT AWAY IF: You develop worsening fever. You become short of breath You cough up blood. Your symptoms persist after you have completed your treatment plan MAKE SURE YOU  Understand these instructions. Will watch your condition. Will get help right away if you are not doing well or get worse.    Thank you for choosing an e-visit.  Your e-visit answers were reviewed by a board certified advanced clinical practitioner to complete your personal care plan. Depending upon the condition, your plan could have included both over the counter or prescription medications.  Please review your pharmacy choice. Make sure the pharmacy is open so you can pick up prescription now. If there is a problem, you may contact your provider through [employer] and have the prescription routed to another pharmacy.  Your safety is important to [place]. If you have drug allergies check your prescription carefully.   For the next 24 hours you can use MyChart  to ask questions about today's visit, request a non-urgent call back, or ask for a work or school excuse. You will get an email in the next two days asking about your experience. I hope that your e-visit has been valuable and will speed your recovery.   I provided 5 minutes of non face-to-face time during this encounter for chart review and  documentation.

## 2021-09-07 NOTE — Progress Notes (Signed)
Message sent to patient requesting further input regarding current symptoms. Awaiting patient response.  

## 2021-09-11 ENCOUNTER — Telehealth: Payer: 59 | Admitting: Physician Assistant

## 2021-09-11 ENCOUNTER — Ambulatory Visit: Payer: Self-pay | Admitting: *Deleted

## 2021-09-11 DIAGNOSIS — J189 Pneumonia, unspecified organism: Secondary | ICD-10-CM | POA: Diagnosis not present

## 2021-09-11 MED ORDER — DOXYCYCLINE HYCLATE 100 MG PO TABS: 100.0000 mg | ORAL_TABLET | Freq: Two times a day (BID) | ORAL | 0 refills | Status: AC

## 2021-09-11 NOTE — Progress Notes (Signed)
Virtual Visit Consent   Jesse Davenport, you are scheduled for a virtual visit with a [facility] provider today.     Just as with appointments in the office, your consent must be obtained to participate.  Your consent will be active for this visit and any virtual visit you may have with one of our providers in the next 365 days.     If you have a MyChart account, a copy of this consent can be sent to you electronically.  All virtual visits are billed to your insurance company just like a traditional visit in the office.    As this is a virtual visit, video technology does not allow for your provider to perform a traditional examination.  This may limit your provider's ability to fully assess your condition.  If your provider identifies any concerns that need to be evaluated in person or the need to arrange testing (such as labs, EKG, etc.), we will make arrangements to do so.     Although advances in technology are sophisticated, we cannot ensure that it will always work on either your end or our end.  If the connection with a video visit is poor, the visit may have to be switched to a telephone visit.  With either a video or telephone visit, we are not always able to ensure that we have a secure connection.     I need to obtain your verbal consent now.   Are you willing to proceed with your visit today?    Jesse Davenport has provided verbal consent on 09/11/2021 for a virtual visit (video or telephone).   Mar Daring, PA-C   Date: 09/11/2021 6:20 PM   Virtual Visit via Video Note   I, Mar Daring, connected with  Jesse Davenport  ([id number], [birth year]) on 09/11/21 at  6:15 PM EST by a video-enabled telemedicine application and verified that I am speaking with the correct person using two identifiers.  Location: Patient: Virtual Visit Location Patient: Home Provider: Virtual Visit Location Provider: Home Office   I discussed the limitations of evaluation and management  by telemedicine and the availability of in person appointments. The patient expressed understanding and agreed to proceed.    History of Present Illness: Jesse Davenport is a 66 y.o. who identifies as a male who was assigned male at birth, and is being seen today for follow up C scan from [facility] today. Completed an e-visit on 09/07/21 and was started on azithromycin for possible COPD exacerbation. Followed up with in person exams today for hematology and cardiology at [facility]. Has not had any improvement. Cardiologist ordered a chest CT for continued symptoms. CT was positive for right middle lobe pneumonia. Cardiologist called report to him and advised him to reach out to [place] for treatment of pneumonia. Took last dose of azithromycin today, has one more day of prednisone for tomorrow. Denies fevers, chills, nausea, vomiting, severe SOB (no more than normal- was able to ambulate around [place] today without limiting SOB).    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   History of stroke 08/05/2020   Prediabetes 08/05/2020   Chronic anticoagulation 06/04/2020   Arterial ischemic stroke, PCA, right, acute ([place]) 05/31/2020   Anemia 05/26/2020   Hypoxemia requiring supplemental oxygen 05/26/2020   Acute respiratory  failure with hypoxia ([place]) 05/24/2020   Atrial fibrillation with RVR ([place]) 05/24/2020   Cavitary lesion of lung 05/24/2020   Liver dysfunction 05/24/2020   Pulmonary emphysema ([place]) 05/24/2020   Stopped smoking with greater than 30 pack year history 05/24/2020    Allergies:  Allergies  Allergen Reactions   Levofloxacin Anaphylaxis   Levalbuterol    Medications:  Current Outpatient Medications:    doxycycline (VIBRA-TABS) 100 MG tablet, Take 1 tablet (100 mg total) by mouth  2 (two) times daily., Disp: 20 tablet, Rfl: 0   albuterol (VENTOLIN HFA) 108 (90 Base) MCG/ACT inhaler, SMARTSIG:2 Puff(s) By Mouth Every 6 Hours PRN, Disp: , Rfl:    azithromycin (ZITHROMAX) 250 MG tablet, Take 2 tablets PO on day one, and one tablet PO daily thereafter until completed., Disp: 6 tablet, Rfl: 0   benzonatate (TESSALON) 100 MG capsule, Take 1 capsule (100 mg total) by mouth 3 (three) times daily as needed., Disp: 30 capsule, Rfl: 0   ELIQUIS 5 MG TABS tablet, Take 5 mg by mouth 2 (two) times daily., Disp: , Rfl:    FLOVENT HFA 110 MCG/ACT inhaler, Inhale 2 puffs into the lungs 2 (two) times daily., Disp: , Rfl:    ketorolac (ACULAR) 0.5 % ophthalmic solution, Place into the left eye., Disp: , Rfl:    predniSONE (DELTASONE) 20 MG tablet, Take 2 tablets (40 mg total) by mouth daily with breakfast., Disp: 10 tablet, Rfl: 0   STIOLTO RESPIMAT 2.5-2.5 MCG/ACT AERS, SMARTSIG:2 Puff(s) Via Inhaler Daily, Disp: , Rfl:    tamsulosin (FLOMAX) 0.4 MG CAPS capsule, Take 1 capsule (0.4 mg total) by mouth daily., Disp: 30 capsule, Rfl: 0  Observations/Objective: Patient is well-developed, well-nourished in no acute distress.  Resting comfortably at home.  Head is normocephalic, atraumatic.  No labored breathing.  Speech is clear and coherent with logical content.  Patient is alert and oriented at baseline.   CT CHEST WITHOUT CONTRAST WITH 3D MIPS PROTOCOL   INDICATION: Cough, persistent, Pneumonia, unresolved, Dyspnea, chronic,  unclear etiology, R06.02 Shortness of breath   COMPARISON: 12/26/2020   PROTOCOL:  Chest CT WO Protocol.  Contiguous 0.625 mm axial images with 5  mm axial, 3 mm coronal, and 3 mm sagittal reconstructions were obtained  from the neck base through the upper abdomen without IV contrast material.  In addition, 3-D maximal intensity projection (MIP) reconstructions were  performed to potentially increase the sensitivity for the detection of  pulmonary nodules.    FINDINGS:   SCOUT IMAGES:   Redemonstrated left-sided pleural thickening and scarring   LOWER NECK:   Normal thyroid. No supraclavicular or axillary lymphadenopathy.   LUNGS/AIRWAYS/PLEURA:   Central airways patent. Unchanged apical predominant centrilobular and  paraseptal emphysema. Unchanged linear lingular opacity compatible with  atelectasis/scar. Similar adjacent mild bronchiectasis. Similar left  basilar pleural thickening and adjacent parenchymal atelectasis/scar.  Increased right middle lobe irregular consolidative opacity measuring up to  2.0 x 1.8 cm (series 3, image 474). Increased right basilar tree-in-bud  nodularity (series 3, image 453). Stable irregular nodule at the left apex  measuring up to 1.1 x 0.7 cm (series 3, image 79). Additional scattered new  solid pulmonary nodules, reference 2 mm right apical nodule (series 3,  image 81) and 4 mm anterior left upper lobe nodule (series 3, image 130).  No pleural effusion.   MEDIASTINUM/HEART/VESSELS:   Unchanged small mediastinal lymph nodes. Biatrial enlargement. Mild  coronary arterial calcifications. Aortic valvular calcifications. No  pericardial effusion. Mild aortic  calcifications. Normal esophagus.   VISIBLE ABDOMEN:   Unremarkable   BONES/SOFT TISSUES:   No suspicious osseous lesions. Multilevel degenerative change of the  thoracic spine.   IMPRESSION:   1. New irregular consolidative opacity in the right middle lobe measuring  up to 2 cm, favored to represent pneumonia, please correlate clinically.  Recommend follow-up CT in 3-6 months to ensure resolution.  2. New tree-in-bud nodularity at the right lung base and scattered new  small solid pulmonary nodules, favor infectious or inflammatory.  3. Stable left pleural thickening and adjacent parenchymal scarring.   UNEXPECTED FINDING: Impression 1 was added to the unexpected findings work  flow to ensure follow-up.   Electronically Reviewed  by:  Moshe Salisbury, MD, [facility]  Electronically Reviewed on:  09/11/2021 2:36 PM   I have reviewed the images and concur with the above findings.   Electronically Signed by:  Tereasa Coop, MD, [facility]  Electronically Signed on:  09/11/2021 2:37 PM  Assessment and Plan: 1. Pneumonia of right middle lobe due to infectious organism - doxycycline (VIBRA-TABS) 100 MG tablet; Take 1 tablet (100 mg total) by mouth 2 (two) times daily.  Dispense: 20 tablet; Refill: 0  - No having any severe SOB, able to speak without issue; walked around campus at [facility] today without much issue (did have some SOB but not enough to stop him from walking around) - No concerning features at this time - Start Doxycycline (Anaphylaxis with levaquin) - Continue inhalers - Monitor home oxygen saturation levels, if drop to 90 or below, proceed to ER - If any symptoms worsen seek immediate care at a local ER - Call back over the weekend if any questions or concerns arise  Follow Up Instructions: I discussed the assessment and treatment plan with the patient. The patient was provided an opportunity to ask questions and all were answered. The patient agreed with the plan and demonstrated an understanding of the instructions.  A copy of instructions were sent to the patient via MyChart unless otherwise noted below.   The patient was advised to call back or seek an in-person evaluation if the symptoms worsen or if the condition fails to improve as anticipated.  Time:  I spent 18 minutes with the patient via telehealth technology discussing the above problems/concerns.    Mar Daring, PA-C

## 2021-09-11 NOTE — Telephone Encounter (Signed)
°  Chief Complaint: requesting additional medication for pneumonia  Symptoms: shortness of breath with exertion continues. Completed z pack a few days ago and continues with SOB Frequency: since 09/07/21 Pertinent Negatives: Patient denies chest pain fever Disposition: [] ED /[x] Urgent Care (no appt availability in office) / [] Appointment(In office/virtual)/ [x]  [facility]/ [] Home Care/ [] Refused Recommended Disposition /[] [facility]/ []  Follow-up with PCP Additional Notes:   No PCP . Recommended UC / ED or virtual UC . Patient has done E visit in the past  on 09/07/21. Verbalized understanding and to call back if he would like to find PCP.   Reason for Disposition  [1] MILD difficulty breathing (e.g., minimal/no SOB at rest, SOB with walking, pulse <100) AND [2] NEW-onset or WORSE than normal  Answer Assessment - Initial Assessment Questions 1. RESPIRATORY STATUS: "Describe your breathing?" (e.g., wheezing, shortness of breath, unable to speak, severe coughing)      Shortness of breath when walking , recent dx pneumonia  2. ONSET: "When did this breathing problem begin?"      09/07/21 3. PATTERN "Does the difficult breathing come and go, or has it been constant since it started?"      na 4. SEVERITY: "How bad is your breathing?" (e.g., mild, moderate, severe)    - MILD: No SOB at rest, mild SOB with walking, speaks normally in sentences, can lie down, no retractions, pulse < 100.    - MODERATE: SOB at rest, SOB with minimal exertion and prefers to sit, cannot lie down flat, speaks in phrases, mild retractions, audible wheezing, pulse 100-120.    - SEVERE: Very SOB at rest, speaks in single words, struggling to breathe, sitting hunched forward, retractions, pulse > 120      Has completed z pack and still c/o shortness of breath 5. RECURRENT SYMPTOM: "Have you had difficulty breathing before?" If Yes, ask: "When was the last time?" and "What happened that time?"      Yes  last e visit 09/07/21 6. CARDIAC HISTORY: "Do you have any history of heart disease?" (e.g., heart attack, angina, bypass surgery, angioplasty)      See hx  7. LUNG HISTORY: "Do you have any history of lung disease?"  (e.g., pulmonary embolus, asthma, emphysema)     Hx COPD 8. CAUSE: "What do you think is causing the breathing problem?"      Dx pneumonia  9. OTHER SYMPTOMS: "Do you have any other symptoms? (e.g., dizziness, runny nose, cough, chest pain, fever)     Shortness of breath with exertion 10. O2 SATURATION MONITOR:  "Do you use an oxygen saturation monitor (pulse oximeter) at home?" If Yes, "What is your reading (oxygen level) today?" "What is your usual oxygen saturation reading?" (e.g., 95%)       na 11. PREGNANCY: "Is there any chance you are pregnant?" "When was your last menstrual period?"       na 12. TRAVEL: "Have you traveled out of the country in the last month?" (e.g., travel history, exposures)       na  Protocols used: Breathing Difficulty-A-AH

## 2021-09-11 NOTE — Patient Instructions (Addendum)
Jesse Davenport, thank you for joining Margaretann Loveless, PA-C for today's virtual visit.  While this provider is not your primary care provider (PCP), if your PCP is located in our provider database this encounter information will be shared with them immediately following your visit.  Consent: (Patient) Jesse Davenport provided verbal consent for this virtual visit at the beginning of the encounter.  Current Medications:  Current Outpatient Medications:    doxycycline (VIBRA-TABS) 100 MG tablet, Take 1 tablet (100 mg total) by mouth 2 (two) times daily., Disp: 20 tablet, Rfl: 0   albuterol (VENTOLIN HFA) 108 (90 Base) MCG/ACT inhaler, SMARTSIG:2 Puff(s) By Mouth Every 6 Hours PRN, Disp: , Rfl:    azithromycin (ZITHROMAX) 250 MG tablet, Take 2 tablets PO on day one, and one tablet PO daily thereafter until completed., Disp: 6 tablet, Rfl: 0   benzonatate (TESSALON) 100 MG capsule, Take 1 capsule (100 mg total) by mouth 3 (three) times daily as needed., Disp: 30 capsule, Rfl: 0   ELIQUIS 5 MG TABS tablet, Take 5 mg by mouth 2 (two) times daily., Disp: , Rfl:    FLOVENT HFA 110 MCG/ACT inhaler, Inhale 2 puffs into the lungs 2 (two) times daily., Disp: , Rfl:    ketorolac (ACULAR) 0.5 % ophthalmic solution, Place into the left eye., Disp: , Rfl:    predniSONE (DELTASONE) 20 MG tablet, Take 2 tablets (40 mg total) by mouth daily with breakfast., Disp: 10 tablet, Rfl: 0   STIOLTO RESPIMAT 2.5-2.5 MCG/ACT AERS, SMARTSIG:2 Puff(s) Via Inhaler Daily, Disp: , Rfl:    tamsulosin (FLOMAX) 0.4 MG CAPS capsule, Take 1 capsule (0.4 mg total) by mouth daily., Disp: 30 capsule, Rfl: 0   Medications ordered in this encounter:  Meds ordered this encounter  Medications   doxycycline (VIBRA-TABS) 100 MG tablet    Sig: Take 1 tablet (100 mg total) by mouth 2 (two) times daily.    Dispense:  20 tablet    Refill:  0    Order Specific Question:   Supervising Provider    Answer:   Hyacinth Meeker, BRIAN [[id number]]      *If you need refills on other medications prior to your next appointment, please contact your pharmacy*  Follow-Up: Call back or seek an in-person evaluation if the symptoms worsen or if the condition fails to improve as anticipated.  Other Instructions Community-Acquired Pneumonia, Adult Pneumonia is a lung infection that causes inflammation and the buildup of mucus and fluids in the lungs. This may cause coughing and difficulty breathing. Community-acquired pneumonia is pneumonia that develops in people who are not, and have not recently been, in a hospital or other health care facility. Usually, pneumonia develops as a result of an illness that is caused by a virus, such as the common cold and the flu (influenza). It can also be caused by bacteria or fungi. While the common cold and influenza can pass from person to person (are contagious), pneumonia itself is not considered contagious. What are the causes? This condition may be caused by: Viruses. Bacteria. Fungi, such as molds or mushrooms. What increases the risk? The following factors may make you more likely to develop this condition: Having certain medical conditions, such as: A long-term (chronic) disease, which may include chronic obstructive pulmonary disease (COPD), asthma, heart failure, cystic fibrosis, diabetes, kidney disease, sickle cell disease, and human immunodeficiency virus (HIV). A condition that increases the risk of breathing in (aspirating) mucus and other fluids from your mouth and nose. A weakened body  defense system (immune system). Having had your spleen removed (splenectomy). The spleen is the organ that helps fight germs and infections. Not cleaning your teeth and gums well (poor dental hygiene). Using tobacco products. Traveling to places where germs that cause pneumonia are present. Being near certain animals, or animal habitats, that have germs that cause pneumonia. Being older than 64 years of  age. What are the signs or symptoms? Symptoms of this condition include: A dry cough or a wet (productive) cough. A fever. Sweating or chills. Chest pain, especially when breathing deeply or coughing. Fast breathing, difficulty breathing, or shortness of breath. Tiredness (fatigue). Muscle aches. How is this diagnosed? This condition may be diagnosed based on your medical history or a physical exam. You may also have tests, including: Chest X-rays. Tests of the level of oxygen and other gases in your blood. Tests of: Your blood. Mucus from your lungs (sputum). Fluid around your lungs (pleural fluid). Your urine. If your pneumonia is severe, other tests may be done to learn more about the cause. How is this treated? Treatment for this condition depends on many factors, such as the cause of your pneumonia, your medicines, and other medical conditions that you have. For most adults, pneumonia may be treated at home. In some cases, treatment must happen in a hospital and may include: Medicines that are given by mouth (orally) or through an IV, including: Antibiotic medicines, if bacteria caused the pneumonia. Medicines that kill viruses (antiviral medicines), if a virus caused the pneumonia. Oxygen therapy. Severe pneumonia, although rare, may require the following treatments: Mechanical ventilation.This procedure uses a machine to help you breathe if you cannot breathe well on your own or maintain a safe level of blood oxygen. Thoracentesis. This procedure removes any buildup of pleural fluid to help with breathing. Follow these instructions at home: Medicines Take over-the-counter and prescription medicines only as told by your health care provider. Take cough medicine only if you have trouble sleeping. Cough medicine can prevent your body from removing mucus from your lungs. If you were prescribed an antibiotic medicine, take it as told by your health care provider. Do not stop  taking the antibiotic even if you start to feel better. Lifestyle   Do not drink alcohol. Do not use any products that contain nicotine or tobacco, such as cigarettes, e-cigarettes, and chewing tobacco. If you need help quitting, ask your health care provider. Eat a healthy diet. This includes plenty of vegetables, fruits, whole grains, low-fat dairy products, and lean protein. General instructions Rest a lot and get at least 8 hours of sleep each night. Sleep in a partly upright position at night. Place a few pillows under your head or sleep in a reclining chair. Return to your normal activities as told by your health care provider. Ask your health care provider what activities are safe for you. Drink enough fluid to keep your urine pale yellow. This helps to thin the mucus in your lungs. If your throat is sore, gargle with a salt-water mixture 3-4 times a day or as needed. To make a salt-water mixture, completely dissolve -1 tsp (3-6 g) of salt in 1 cup (237 mL) of warm water. Keep all follow-up visits as told by your health care provider. This is important. How is this prevented? You can lower your risk of developing community-acquired pneumonia by: Getting the pneumonia vaccine. There are different types and schedules of pneumonia vaccines. Ask your health care provider which option is best for you. Consider  getting the pneumonia vaccine if: You are older than 64 years of age. You are 62-41 years of age and are receiving cancer treatment, have chronic lung disease, or have other medical conditions that affect your immune system. Ask your health care provider if this applies to you. Getting your influenza vaccine every year. Ask your health care provider which type of vaccine is best for you. Getting regular dental checkups. Washing your hands often with soap and water for at least 20 seconds. If soap and water are not available, use hand sanitizer. Contact a health care provider if you  have: A fever. Trouble sleeping because you cannot control your cough with cough medicine. Get help right away if: Your shortness of breath becomes worse. Your chest pain increases. Your sickness becomes worse, especially if you are an older adult or have a weak immune system. You cough up blood. These symptoms may represent a serious problem that is an emergency. Do not wait to see if the symptoms will go away. Get medical help right away. Call your local emergency services (911 in the [place] Do not drive yourself to the hospital. Summary Pneumonia is an infection of the lungs. Community-acquired pneumonia develops in people who have not been in the hospital. It can be caused by bacteria, viruses, or fungi. This condition may be treated with antibiotics or antiviral medicines. Severe pneumonia may require a hospital stay and treatment to help with breathing. This information is not intended to replace advice given to you by your health care provider. Make sure you discuss any questions you have with your health care provider. Document Revised: 04/17/2019 Document Reviewed: 04/17/2019 Elsevier Patient Education  2022 [employer].    If you have been instructed to have an in-person evaluation today at a local Urgent Care facility, please use the link below. It will take you to a list of all of our available [facility], including address, phone number and hours of operation. Please do not delay care.  [facility]  If you or a family member do not have a primary care provider, use the link below to schedule a visit and establish care. When you choose a [facility] primary care physician or advanced practice provider, you gain a long-term partner in health. Find a Primary Care Provider  Learn more about [facility]'s in-office and virtual care options: [facility] - Get Care Now

## 2021-09-20 ENCOUNTER — Other Ambulatory Visit: Payer: Self-pay

## 2021-09-20 ENCOUNTER — Emergency Department
Admission: EM | Admit: 2021-09-20 | Discharge: 2021-09-20 | Disposition: A | Discharge status: Home / Self Care | Payer: 59 | Attending: Emergency Medicine | Admitting: Emergency Medicine

## 2021-09-20 DIAGNOSIS — X088XXA Exposure to other specified smoke, fire and flames, initial encounter: Secondary | ICD-10-CM | POA: Diagnosis not present

## 2021-09-20 DIAGNOSIS — I4891 Unspecified atrial fibrillation: Secondary | ICD-10-CM | POA: Insufficient documentation

## 2021-09-20 DIAGNOSIS — T23201A Burn of second degree of right hand, unspecified site, initial encounter: Secondary | ICD-10-CM | POA: Insufficient documentation

## 2021-09-20 DIAGNOSIS — Z7901 Long term (current) use of anticoagulants: Secondary | ICD-10-CM | POA: Diagnosis not present

## 2021-09-20 DIAGNOSIS — S6991XA Unspecified injury of right wrist, hand and finger(s), initial encounter: Secondary | ICD-10-CM | POA: Diagnosis present

## 2021-09-20 DIAGNOSIS — T23221A Burn of second degree of single right finger (nail) except thumb, initial encounter: Secondary | ICD-10-CM

## 2021-09-20 HISTORY — DX: Unspecified atrial fibrillation: I48.91

## 2021-09-20 HISTORY — DX: Cerebral infarction, unspecified: I63.9

## 2021-09-20 MED ORDER — SILVER SULFADIAZINE 1 % EX CREA: TOPICAL_CREAM | CUTANEOUS | 1 refills | Status: AC

## 2021-09-20 MED ORDER — SILVER SULFADIAZINE 1 % EX CREA: TOPICAL_CREAM | Freq: Two times a day (BID) | CUTANEOUS | Status: DC

## 2021-09-20 MED ORDER — BUPIVACAINE HCL 0.5 % IJ SOLN
10.0000 mL | Freq: Once | INTRAMUSCULAR | Status: AC
  Administered 2021-09-20: 10 mL
  Filled 2021-09-20: qty 10

## 2021-09-20 MED ORDER — OXYCODONE-ACETAMINOPHEN 5-325 MG PO TABS: 1.0000 | ORAL_TABLET | Freq: Four times a day (QID) | ORAL | 0 refills | Status: AC | PRN

## 2021-09-20 MED ORDER — SILVER SULFADIAZINE 1 % EX CREA
TOPICAL_CREAM | Freq: Two times a day (BID) | CUTANEOUS | Status: DC
  Administered 2021-09-20: 1 via TOPICAL
  Filled 2021-09-20: qty 20

## 2021-09-20 MED ORDER — CEPHALEXIN 500 MG PO CAPS: 500.0000 mg | ORAL_CAPSULE | Freq: Two times a day (BID) | ORAL | 0 refills | Status: AC

## 2021-09-20 MED ORDER — OXYCODONE HCL 5 MG PO TABS
5.0000 mg | ORAL_TABLET | ORAL | Status: AC
  Administered 2021-09-20: 5 mg via ORAL
  Filled 2021-09-20: qty 1

## 2021-09-20 NOTE — ED Notes (Signed)
Provided wound dressing with vaseline gauze placed over silver sulfalazine ointment, covered with dry gauze. ? ? ?

## 2021-09-20 NOTE — Discharge Instructions (Addendum)
[facility] operates an outpatient clinic for children and adults. This clinic is separate from the inpatient [facility] Center unit. The [facility] Outpatient Clinic is located on the first floor of [facility]. If you need a wheelchair or other assistance, please see the attendant at the entrance to the hospital. ? ?Burn Clinic Hours ?Mondays through Friday 8 AM to 4 PM ? ?Please Call for An Appointment, [phone] ?
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
## 2022-02-22 ENCOUNTER — Other Ambulatory Visit: Payer: Self-pay

## 2022-02-22 DIAGNOSIS — R972 Elevated prostate specific antigen [PSA]: Secondary | ICD-10-CM

## 2022-03-01 ENCOUNTER — Other Ambulatory Visit: Payer: 59

## 2022-03-01 DIAGNOSIS — R972 Elevated prostate specific antigen [PSA]: Secondary | ICD-10-CM

## 2022-03-02 ENCOUNTER — Telehealth: Payer: Self-pay | Admitting: *Deleted

## 2022-03-02 LAB — PSA: Prostate Specific Ag, Serum: 2.7 ng/mL (ref 0.0–4.0)

## 2022-03-02 NOTE — Telephone Encounter (Signed)
-----   Message from Riki Altes, MD sent at 03/02/2022  8:56 AM EDT ----- PSA was normal at 2.7.  It looks like he canceled tomorrow's appointment

## 2022-03-02 NOTE — Telephone Encounter (Signed)
Left message on voice mail per DPR .  °

## 2022-03-03 ENCOUNTER — Ambulatory Visit: Payer: 59 | Admitting: Urology

## 2022-05-06 ENCOUNTER — Ambulatory Visit (INDEPENDENT_AMBULATORY_CARE_PROVIDER_SITE_OTHER): Payer: 59 | Admitting: Vascular Surgery

## 2022-05-06 ENCOUNTER — Encounter (INDEPENDENT_AMBULATORY_CARE_PROVIDER_SITE_OTHER): Payer: 59

## 2022-05-17 ENCOUNTER — Encounter (INDEPENDENT_AMBULATORY_CARE_PROVIDER_SITE_OTHER): Payer: Self-pay

## 2023-05-13 ENCOUNTER — Ambulatory Visit (LOCAL_COMMUNITY_HEALTH_CENTER): Payer: Medicare Other

## 2023-05-13 DIAGNOSIS — Z23 Encounter for immunization: Secondary | ICD-10-CM | POA: Diagnosis not present

## 2023-05-13 DIAGNOSIS — Z719 Counseling, unspecified: Secondary | ICD-10-CM

## 2023-05-13 NOTE — Progress Notes (Signed)
Client seen in nurse clinic for vaccines.  Vaccines administered and tolerated well.  Client stay about 10 minutes post vaccines, and then left.  Client with no questions.  .me

## 2023-05-13 NOTE — Progress Notes (Signed)
Mr. Batz in clinic today requesting testing for pertussis.  He reports exposure to pertussis about one week ago while attending a funeral in [place].  He has had a productive cough for about a week, runny nose and headache.  However, he does report a history of chronic cough.  He contacted his PCP, [facility] and was told they could not test him.  He contacted [facility] and was also told they could not test him and he should contact the health dept.    Consulted with Irena Cords, MD;  Isolation guidelines reviewed with patient; Clinical overview of pertussis reviewed with patient; Will test for pertussis with PCR and culture through [facility], timeline of when to expect results reviewed with patient; Azithromycin 250 mg tabs, take two by mouth day one, then take one by mouth days 2-5, No refills.  Prescription called to [facility] on [address] Tdap vaccine given; Follow up with PCP if symptoms worsen

## 2023-05-17 ENCOUNTER — Telehealth: Payer: Self-pay

## 2023-05-17 NOTE — Telephone Encounter (Signed)
Informed of negative Pertussis PCR results.  States he is feeling better and will complete antibiotics today.
